# Patient Record
Sex: Male | Born: 1970 | Race: Black or African American | Hispanic: No | Marital: Married | Smoking: Current every day smoker
Health system: Southern US, Community
[De-identification: ages and names within clinical notes are randomized; demographics above are authoritative.]

## PROBLEM LIST (undated history)

## (undated) DIAGNOSIS — F1910 Other psychoactive substance abuse, uncomplicated: Secondary | ICD-10-CM

## (undated) DIAGNOSIS — J40 Bronchitis, not specified as acute or chronic: Secondary | ICD-10-CM

---

## 2001-01-05 ENCOUNTER — Emergency Department (HOSPITAL_COMMUNITY): Admission: EM | Admit: 2001-01-05 | Discharge: 2001-01-05 | Payer: Self-pay | Admitting: Emergency Medicine

## 2001-01-05 ENCOUNTER — Encounter: Payer: Self-pay | Admitting: Emergency Medicine

## 2001-02-06 ENCOUNTER — Emergency Department (HOSPITAL_COMMUNITY): Admission: EM | Admit: 2001-02-06 | Discharge: 2001-02-06 | Payer: Self-pay | Admitting: Emergency Medicine

## 2003-07-04 ENCOUNTER — Emergency Department (HOSPITAL_COMMUNITY): Admission: EM | Admit: 2003-07-04 | Discharge: 2003-07-04 | Payer: Self-pay | Admitting: Emergency Medicine

## 2008-01-04 ENCOUNTER — Emergency Department (HOSPITAL_COMMUNITY): Admission: EM | Admit: 2008-01-04 | Discharge: 2008-01-04 | Payer: Self-pay | Admitting: Emergency Medicine

## 2008-10-02 ENCOUNTER — Emergency Department (HOSPITAL_COMMUNITY): Admission: EM | Admit: 2008-10-02 | Discharge: 2008-10-02 | Payer: Self-pay | Admitting: Emergency Medicine

## 2009-01-22 ENCOUNTER — Emergency Department (HOSPITAL_COMMUNITY): Admission: EM | Admit: 2009-01-22 | Discharge: 2009-01-22 | Payer: Self-pay | Admitting: Emergency Medicine

## 2009-03-04 ENCOUNTER — Inpatient Hospital Stay (HOSPITAL_COMMUNITY): Admission: EM | Admit: 2009-03-04 | Discharge: 2009-03-07 | Payer: Self-pay | Admitting: Emergency Medicine

## 2009-09-11 ENCOUNTER — Emergency Department (HOSPITAL_COMMUNITY): Admission: EM | Admit: 2009-09-11 | Discharge: 2009-09-11 | Payer: Self-pay | Admitting: Emergency Medicine

## 2010-08-12 LAB — URINALYSIS, ROUTINE W REFLEX MICROSCOPIC
Bilirubin Urine: NEGATIVE
Nitrite: NEGATIVE
Specific Gravity, Urine: 1.017 (ref 1.005–1.030)
pH: 5.5 (ref 5.0–8.0)

## 2010-08-12 LAB — CBC
Hemoglobin: 14.3 g/dL (ref 13.0–17.0)
RDW: 13.5 % (ref 11.5–15.5)
WBC: 11.2 10*3/uL — ABNORMAL HIGH (ref 4.0–10.5)

## 2010-08-12 LAB — DIFFERENTIAL
Basophils Absolute: 0.1 10*3/uL (ref 0.0–0.1)
Lymphocytes Relative: 27 % (ref 12–46)
Lymphs Abs: 3.1 10*3/uL (ref 0.7–4.0)
Monocytes Absolute: 1.1 10*3/uL — ABNORMAL HIGH (ref 0.1–1.0)
Neutro Abs: 6.9 10*3/uL (ref 1.7–7.7)

## 2010-08-12 LAB — BASIC METABOLIC PANEL
BUN: 15 mg/dL (ref 6–23)
CO2: 20 mEq/L (ref 19–32)
Calcium: 8.5 mg/dL (ref 8.4–10.5)
Chloride: 109 mEq/L (ref 96–112)
Creatinine, Ser: 1.47 mg/dL (ref 0.4–1.5)
GFR calc Af Amer: >60 mL/min (ref >60)
GFR calc non Af Amer: 54 mL/min — ABNORMAL LOW (ref >60)
Glucose, Bld: 94 mg/dL (ref 70–99)
Potassium: 4 mEq/L (ref 3.5–5.1)
Sodium: 137 mEq/L (ref 135–145)

## 2010-08-12 LAB — POCT CARDIAC MARKERS
CKMB, poc: <1 ng/mL — ABNORMAL LOW (ref 1.0–8.0)
Myoglobin, poc: 59.3 ng/mL (ref 12–200)

## 2010-08-12 LAB — GLUCOSE, CAPILLARY: Glucose-Capillary: 111 mg/dL — ABNORMAL HIGH (ref 70–99)

## 2010-08-28 LAB — COMPREHENSIVE METABOLIC PANEL
ALT: 40 U/L (ref 0–53)
ALT: 79 U/L — ABNORMAL HIGH (ref 0–53)
Albumin: 2.9 g/dL — ABNORMAL LOW (ref 3.5–5.2)
Albumin: 3.2 g/dL — ABNORMAL LOW (ref 3.5–5.2)
Alkaline Phosphatase: 87 U/L (ref 39–117)
BUN: 5 mg/dL — ABNORMAL LOW (ref 6–23)
BUN: 6 mg/dL (ref 6–23)
Calcium: 9.3 mg/dL (ref 8.4–10.5)
Chloride: 115 mEq/L — ABNORMAL HIGH (ref 96–112)
Creatinine, Ser: 0.92 mg/dL (ref 0.4–1.5)
Creatinine, Ser: 1.01 mg/dL (ref 0.4–1.5)
GFR calc Af Amer: >60 mL/min (ref >60)
GFR calc non Af Amer: >60 mL/min (ref >60)
Glucose, Bld: 101 mg/dL — ABNORMAL HIGH (ref 70–99)
Glucose, Bld: 111 mg/dL — ABNORMAL HIGH (ref 70–99)
Potassium: 3.7 mEq/L (ref 3.5–5.1)
Sodium: 139 mEq/L (ref 135–145)
Sodium: 140 mEq/L (ref 135–145)
Total Bilirubin: 0.5 mg/dL (ref 0.3–1.2)
Total Bilirubin: 0.7 mg/dL (ref 0.3–1.2)
Total Protein: 5.8 g/dL — ABNORMAL LOW (ref 6.0–8.3)
Total Protein: 6.3 g/dL (ref 6.0–8.3)
Total Protein: 6.3 g/dL (ref 6.0–8.3)

## 2010-08-28 LAB — CARDIAC PANEL(CRET KIN+CKTOT+MB+TROPI)
CK, MB: 8.5 ng/mL — ABNORMAL HIGH (ref 0.3–4.0)
Relative Index: 1.3 (ref 0.0–2.5)
Relative Index: 1.3 (ref 0.0–2.5)
Total CK: 435 U/L — ABNORMAL HIGH (ref 7–232)
Total CK: 611 U/L — ABNORMAL HIGH (ref 7–232)
Total CK: 630 U/L — ABNORMAL HIGH (ref 7–232)
Troponin I: 0.01 ng/mL (ref 0.00–0.06)
Troponin I: 0.01 ng/mL (ref 0.00–0.06)

## 2010-08-28 LAB — CBC
HCT: 39.8 % (ref 39.0–52.0)
HCT: 40.8 % (ref 39.0–52.0)
HCT: 42.7 % (ref 39.0–52.0)
Hemoglobin: 13.8 g/dL (ref 13.0–17.0)
MCHC: 34.5 g/dL (ref 30.0–36.0)
MCV: 91.2 fL (ref 78.0–100.0)
MCV: 91.9 fL (ref 78.0–100.0)
Platelets: 187 10*3/uL (ref 150–400)
Platelets: 205 10*3/uL (ref 150–400)
Platelets: 225 10*3/uL (ref 150–400)
RDW: 13 % (ref 11.5–15.5)
RDW: 13.2 % (ref 11.5–15.5)
RDW: 13.5 % (ref 11.5–15.5)
WBC: 13.1 10*3/uL — ABNORMAL HIGH (ref 4.0–10.5)

## 2010-08-28 LAB — BLOOD GAS, VENOUS
Acid-base deficit: 5.1 mmol/L — ABNORMAL HIGH (ref 0.0–2.0)
Bicarbonate: 20.4 mEq/L (ref 20.0–24.0)
FIO2: 0.21 %
O2 Saturation: 88.3 %
Patient temperature: 98.6
TCO2: 18.3 mmol/L (ref 0–100)

## 2010-08-28 LAB — RAPID URINE DRUG SCREEN, HOSP PERFORMED
Barbiturates: NOT DETECTED
Opiates: NOT DETECTED
Tetrahydrocannabinol: NOT DETECTED

## 2010-08-28 LAB — HEPATITIS PANEL, ACUTE
HCV Ab: NEGATIVE
Hep B C IgM: NEGATIVE
Hepatitis B Surface Ag: NEGATIVE

## 2010-08-28 LAB — DIFFERENTIAL
Basophils Absolute: 0.1 10*3/uL (ref 0.0–0.1)
Lymphs Abs: 4.1 10*3/uL — ABNORMAL HIGH (ref 0.7–4.0)
Monocytes Absolute: 0.9 10*3/uL (ref 0.1–1.0)

## 2010-08-28 LAB — POCT I-STAT, CHEM 8
BUN: 7 mg/dL (ref 6–23)
Calcium, Ion: 1.03 mmol/L — ABNORMAL LOW (ref 1.12–1.32)
Creatinine, Ser: 1.4 mg/dL (ref 0.4–1.5)
Glucose, Bld: 83 mg/dL (ref 70–99)
TCO2: 18 mmol/L (ref 0–100)

## 2010-08-28 LAB — URINE CULTURE: Colony Count: NO GROWTH

## 2010-08-28 LAB — APTT: aPTT: 25 seconds (ref 24–37)

## 2010-08-28 LAB — MAGNESIUM: Magnesium: 2.2 mg/dL (ref 1.5–2.5)

## 2010-08-28 LAB — HEPATIC FUNCTION PANEL
ALT: 64 U/L — ABNORMAL HIGH (ref 0–53)
Bilirubin, Direct: 0.1 mg/dL (ref 0.0–0.3)
Indirect Bilirubin: 0.7 mg/dL (ref 0.3–0.9)

## 2010-08-28 LAB — BASIC METABOLIC PANEL
BUN: 5 mg/dL — ABNORMAL LOW (ref 6–23)
Calcium: 7.9 mg/dL — ABNORMAL LOW (ref 8.4–10.5)
Creatinine, Ser: 0.87 mg/dL (ref 0.4–1.5)
GFR calc Af Amer: >60 mL/min (ref >60)

## 2010-08-28 LAB — URINALYSIS, ROUTINE W REFLEX MICROSCOPIC
Bilirubin Urine: NEGATIVE
Nitrite: NEGATIVE
Specific Gravity, Urine: 1.011 (ref 1.005–1.030)
pH: 5.5 (ref 5.0–8.0)

## 2010-08-28 LAB — ACETAMINOPHEN LEVEL: Acetaminophen (Tylenol), Serum: <10 ug/mL — ABNORMAL LOW (ref 10–30)

## 2010-08-28 LAB — PROTIME-INR: Prothrombin Time: 12.9 seconds (ref 11.6–15.2)

## 2010-09-02 LAB — URINALYSIS, ROUTINE W REFLEX MICROSCOPIC
Nitrite: NEGATIVE
Specific Gravity, Urine: 1.023 (ref 1.005–1.030)
Urobilinogen, UA: 0.2 mg/dL (ref 0.0–1.0)

## 2010-09-02 LAB — DIFFERENTIAL
Basophils Absolute: 0.2 10*3/uL — ABNORMAL HIGH (ref 0.0–0.1)
Basophils Relative: 1 % (ref 0–1)
Neutro Abs: 7.6 10*3/uL (ref 1.7–7.7)
Neutrophils Relative %: 63 % (ref 43–77)

## 2010-09-02 LAB — POCT I-STAT, CHEM 8
Glucose, Bld: 90 mg/dL (ref 70–99)
HCT: 47 % (ref 39.0–52.0)
Hemoglobin: 16 g/dL (ref 13.0–17.0)
Potassium: 3.8 mEq/L (ref 3.5–5.1)
Sodium: 136 mEq/L (ref 135–145)

## 2010-09-02 LAB — CBC
MCHC: 35.5 g/dL (ref 30.0–36.0)
RBC: 4.94 MIL/uL (ref 4.22–5.81)
RDW: 14.7 % (ref 11.5–15.5)
WBC: 12 10*3/uL — ABNORMAL HIGH (ref 4.0–10.5)

## 2010-10-20 ENCOUNTER — Emergency Department (HOSPITAL_COMMUNITY)
Admission: EM | Admit: 2010-10-20 | Discharge: 2010-10-20 | Disposition: A | Discharge status: Home / Self Care | Payer: Self-pay | Attending: Emergency Medicine | Admitting: Emergency Medicine

## 2010-10-20 DIAGNOSIS — R220 Localized swelling, mass and lump, head: Secondary | ICD-10-CM | POA: Insufficient documentation

## 2010-10-20 DIAGNOSIS — K089 Disorder of teeth and supporting structures, unspecified: Secondary | ICD-10-CM | POA: Insufficient documentation

## 2010-10-20 DIAGNOSIS — K047 Periapical abscess without sinus: Secondary | ICD-10-CM | POA: Insufficient documentation

## 2010-10-20 DIAGNOSIS — R599 Enlarged lymph nodes, unspecified: Secondary | ICD-10-CM | POA: Insufficient documentation

## 2010-10-20 DIAGNOSIS — K029 Dental caries, unspecified: Secondary | ICD-10-CM | POA: Insufficient documentation

## 2011-07-29 ENCOUNTER — Emergency Department (HOSPITAL_COMMUNITY): Payer: Self-pay

## 2011-07-29 ENCOUNTER — Other Ambulatory Visit: Payer: Self-pay

## 2011-07-29 ENCOUNTER — Emergency Department (HOSPITAL_COMMUNITY)
Admission: EM | Admit: 2011-07-29 | Discharge: 2011-07-29 | Disposition: A | Discharge status: Home / Self Care | Payer: Self-pay | Attending: Emergency Medicine | Admitting: Emergency Medicine

## 2011-07-29 ENCOUNTER — Encounter (HOSPITAL_COMMUNITY): Payer: Self-pay | Admitting: Adult Health

## 2011-07-29 DIAGNOSIS — G510 Bell's palsy: Secondary | ICD-10-CM | POA: Insufficient documentation

## 2011-07-29 DIAGNOSIS — J3489 Other specified disorders of nose and nasal sinuses: Secondary | ICD-10-CM | POA: Insufficient documentation

## 2011-07-29 DIAGNOSIS — R29810 Facial weakness: Secondary | ICD-10-CM | POA: Insufficient documentation

## 2011-07-29 HISTORY — DX: Bronchitis, not specified as acute or chronic: J40

## 2011-07-29 LAB — BASIC METABOLIC PANEL
Chloride: 103 mEq/L (ref 96–112)
Creatinine, Ser: 1.02 mg/dL (ref 0.50–1.35)
GFR calc Af Amer: >90 mL/min (ref >90)
Potassium: 3.6 mEq/L (ref 3.5–5.1)

## 2011-07-29 LAB — DIFFERENTIAL
Basophils Absolute: 0 10*3/uL (ref 0.0–0.1)
Lymphocytes Relative: 29 % (ref 12–46)
Monocytes Relative: 8 % (ref 3–12)
Neutro Abs: 8.2 10*3/uL — ABNORMAL HIGH (ref 1.7–7.7)
Neutrophils Relative %: 62 % (ref 43–77)

## 2011-07-29 LAB — CBC
HCT: 43.3 % (ref 39.0–52.0)
RDW: 13.7 % (ref 11.5–15.5)
WBC: 13.3 10*3/uL — ABNORMAL HIGH (ref 4.0–10.5)

## 2011-07-29 MED ORDER — VALACYCLOVIR HCL 1 G PO TABS: 1000.0000 mg | ORAL_TABLET | Freq: Three times a day (TID) | ORAL | Status: DC

## 2011-07-29 MED ORDER — ACYCLOVIR 400 MG PO TABS: 800.0000 mg | ORAL_TABLET | Freq: Every day | ORAL | Status: AC

## 2011-07-29 MED ORDER — PREDNISONE 20 MG PO TABS
60.0000 mg | ORAL_TABLET | Freq: Once | ORAL | Status: AC
  Administered 2011-07-29: 60 mg via ORAL
  Filled 2011-07-29: qty 3

## 2011-07-29 MED ORDER — PREDNISONE 20 MG PO TABS: 60.0000 mg | ORAL_TABLET | Freq: Once | ORAL | Status: AC

## 2011-07-29 MED ORDER — VALACYCLOVIR HCL 500 MG PO TABS
1000.0000 mg | ORAL_TABLET | Freq: Once | ORAL | Status: AC
  Administered 2011-07-29: 1000 mg via ORAL
  Filled 2011-07-29: qty 2

## 2011-07-29 NOTE — ED Notes (Signed)
MD at bedside. 

## 2011-07-29 NOTE — ED Provider Notes (Signed)
History     CSN: [id number]  Arrival date & time 07/29/11  1326   First MD Initiated Contact with Patient 07/29/11 1327      Chief Complaint  Patient presents with  . Facial Droop     HPI The patient awoke this morning, approximately 5 hours ago with new right facial droop.  He notes that when he went to bed he felt an odd sensation radiating from his sternum to his face, no focal asymmetry that time.  Since the onset of symptoms this morning there has been persistent right facial droop, difficulty opening his right eye, uneven smile.  Patient denies any weakness, loss of sensation, confusion, visual changes. No headache, no dyspnea, no chest pain.  The patient smokes and drinks No past medical history on file.  No past surgical history on file.  No family history on file.  History  Substance Use Topics  . Smoking status: Not on file  . Smokeless tobacco: Not on file  . Alcohol Use: Not on file   Patient also uses cocaine.   Review of Systems  Constitutional:       Per HPI, otherwise negative  HENT:       Per HPI, otherwise negative  Eyes: Negative.   Respiratory:       Per HPI, otherwise negative  Cardiovascular:       Per HPI, otherwise negative  Gastrointestinal: Negative for vomiting.  Genitourinary: Negative.   Musculoskeletal:       Per HPI, otherwise negative  Skin: Negative.   Neurological: Positive for facial asymmetry. Negative for dizziness, tremors, seizures, syncope, speech difficulty, weakness, light-headedness, numbness and headaches.    Allergies  Review of patient's allergies indicates not on file.  Home Medications  No current outpatient prescriptions on file.  BP 149/94  Pulse 95  Resp 18  SpO2 96%  Physical Exam  Nursing note and vitals reviewed. Constitutional: He is oriented to person, place, and time. He appears well-developed. No distress.  HENT:  Head: Normocephalic and atraumatic. Head is without raccoon's eyes, without  Battle's sign, without right periorbital erythema and without left periorbital erythema.       Right facial droop  Eyes: Conjunctivae and EOM are normal.  Cardiovascular: Normal rate and regular rhythm.   Pulmonary/Chest: Effort normal. No stridor. No respiratory distress.  Abdominal: He exhibits no distension.  Musculoskeletal: He exhibits no edema.  Neurological: He is alert and oriented to person, place, and time. A cranial nerve deficit is present. No sensory deficit. He exhibits abnormal muscle tone. Coordination normal.       The patient's right facial droop is consistent with Bell's palsy.  Skin: Skin is warm and dry.  Psychiatric: He has a normal mood and affect.    ED Course  Procedures (including critical care time)   Labs Reviewed  BASIC METABOLIC PANEL  CBC  DIFFERENTIAL   No results found.   No diagnosis found.  CT, CXR both reviewed by me.    MDM  This 41-year-old male with history of substance abuse now presents with new right facial droop.  On exam the patient is in no distress, with unremarkable vital signs he does have physical exam findings consistent with Bell's palsy.  The patient's evaluation here was largely unremarkable.  I discussed all findings thoughts with the patient and his companion.  The patient received a dose of steroids as well as antivirals in the emergency department, was discharged with these medications to follow up with  his primary care physician.  Gerhard Munch, MD 07/30/11 [phone]

## 2011-07-29 NOTE — Discharge Instructions (Signed)
Bell's Palsy  Bell's palsy is a condition in which the muscles on one side of the face cannot move (paralysis). This is because the nerves in the face are paralyzed. It is most often thought to be caused by a virus. The virus causes swelling of the nerve that controls movement on one side of the face. The nerve travels through a tight space surrounded by bone. When the nerve swells, it can be compressed by the bone. This results in damage to the protective covering around the nerve. This damage interferes with how the nerve communicates with the muscles of the face. As a result, it can cause weakness or paralysis of the facial muscles.   Injury (trauma), tumor, and surgery may cause Bell's palsy, but most of the time the cause is unknown. It is a relatively common condition. It starts suddenly (abrupt onset) with the paralysis usually ending within 2 days. Bell's palsy is not dangerous. But because the eye does not close properly, you may need care to keep the eye from getting dry. This can include splinting (to keep the eye shut) or moistening with artificial tears. Bell's palsy very seldom occurs on both sides of the face at the same time.  SYMPTOMS    Eyebrow sagging.   Drooping of the eyelid and corner of the mouth.   Inability to close one eye.   Loss of taste on the front of the tongue.   Sensitivity to loud noises.  TREATMENT   The treatment is usually non-surgical. If the patient is seen within the first 24 to 48 hours, a short course of steroids may be prescribed, in an attempt to shorten the length of the condition. Antiviral medicines may also be used with the steroids, but it is unclear if they are helpful.   You will need to protect your eye, if you cannot close it. The cornea (clear covering over your eye) will become dry and can be damaged. Artificial tears can be used to keep your eye moist. Glasses or an eye patch should be worn to protect your eye.  PROGNOSIS   Recovery is variable, ranging  from days to months. Although the problem usually goes away completely (about 80% of cases resolve), predicting the outcome is impossible. Most people improve within 3 weeks of when the symptoms began. Improvement may continue for 3 to 6 months. A small number of people have moderate to severe weakness that is permanent.   HOME CARE INSTRUCTIONS    If your caregiver prescribed medication to reduce swelling in the nerve, use as directed. Do not stop taking the medication unless directed by your caregiver.   Use moisturizing eye drops as needed to prevent drying of your eye, as directed by your caregiver.   Protect your eye, as directed by your caregiver.   Use facial massage and exercises, as directed by your caregiver.   Perform your normal activities, and get your normal rest.  SEEK IMMEDIATE MEDICAL CARE IF:    There is pain, redness or irritation in the eye.   You or your child has an oral temperature above 102 F (38.9 C), not controlled by medicine.  MAKE SURE YOU:    Understand these instructions.   Will watch your condition.   Will get help right away if you are not doing well or get worse.  Document Released: 05/11/2005 Document Revised: 04/30/2011 Document Reviewed: 05/20/2009  Exit[employer] Patient Information 2012 [employer].

## 2011-07-29 NOTE — ED Notes (Signed)
Pt reports going to bed last night around 1am and felt an "odd sensation" from his chest to his head and had an episode of sweating. Woke this am with R sided facial droop. Unable to open R eye completely, unequal smile, unable to raise R eyebrow. C/o weakness at window. Ambulatory to window. Jeraldine Loots, EDP made aware, pt brought to Acute Rm 19. EDP at bedside.

## 2011-07-29 NOTE — ED Notes (Signed)
Woke up this am with right sided facial droop. Alert and oriented, answering questions appropriately.

## 2011-10-19 ENCOUNTER — Emergency Department (HOSPITAL_COMMUNITY)
Admission: EM | Admit: 2011-10-19 | Discharge: 2011-10-19 | Disposition: A | Discharge status: Home / Self Care | Payer: Self-pay | Attending: Emergency Medicine | Admitting: Emergency Medicine

## 2011-10-19 ENCOUNTER — Encounter (HOSPITAL_COMMUNITY): Payer: Self-pay | Admitting: *Deleted

## 2011-10-19 ENCOUNTER — Emergency Department (HOSPITAL_COMMUNITY): Payer: Self-pay

## 2011-10-19 DIAGNOSIS — R7989 Other specified abnormal findings of blood chemistry: Secondary | ICD-10-CM | POA: Insufficient documentation

## 2011-10-19 DIAGNOSIS — R079 Chest pain, unspecified: Secondary | ICD-10-CM | POA: Insufficient documentation

## 2011-10-19 DIAGNOSIS — R945 Abnormal results of liver function studies: Secondary | ICD-10-CM

## 2011-10-19 HISTORY — DX: Other psychoactive substance abuse, uncomplicated: F19.10

## 2011-10-19 LAB — DIFFERENTIAL
Basophils Absolute: 0 10*3/uL (ref 0.0–0.1)
Basophils Relative: 0 % (ref 0–1)
Eosinophils Relative: 1 % (ref 0–5)
Monocytes Absolute: 0.6 10*3/uL (ref 0.1–1.0)

## 2011-10-19 LAB — COMPREHENSIVE METABOLIC PANEL
ALT: 66 U/L — ABNORMAL HIGH (ref 0–53)
Albumin: 3.1 g/dL — ABNORMAL LOW (ref 3.5–5.2)
Alkaline Phosphatase: 133 U/L — ABNORMAL HIGH (ref 39–117)
BUN: 8 mg/dL (ref 6–23)
Chloride: 106 mEq/L (ref 96–112)
GFR calc Af Amer: >90 mL/min (ref >90)
Glucose, Bld: 92 mg/dL (ref 70–99)
Potassium: 3.5 mEq/L (ref 3.5–5.1)
Sodium: 139 mEq/L (ref 135–145)
Total Bilirubin: 0.7 mg/dL (ref 0.3–1.2)
Total Protein: 6.1 g/dL (ref 6.0–8.3)

## 2011-10-19 LAB — CBC
HCT: 39.9 % (ref 39.0–52.0)
MCHC: 36.3 g/dL — ABNORMAL HIGH (ref 30.0–36.0)
MCV: 84.5 fL (ref 78.0–100.0)
Platelets: 199 10*3/uL (ref 150–400)
RDW: 13.8 % (ref 11.5–15.5)
WBC: 9.9 10*3/uL (ref 4.0–10.5)

## 2011-10-19 LAB — LIPASE, BLOOD: Lipase: 50 U/L (ref 11–59)

## 2011-10-19 MED ORDER — SODIUM CHLORIDE 0.9 % IV SOLN
1000.0000 mL | Freq: Once | INTRAVENOUS | Status: AC
  Administered 2011-10-19: 1000 mL via INTRAVENOUS

## 2011-10-19 MED ORDER — SODIUM CHLORIDE 0.9 % IV SOLN
1000.0000 mL | INTRAVENOUS | Status: DC
  Administered 2011-10-19: 1000 mL via INTRAVENOUS

## 2011-10-19 NOTE — ED Notes (Signed)
Patient admits to doing "coke" on Friday, has history of substance abue

## 2011-10-19 NOTE — ED Provider Notes (Signed)
History     CSN: [id number]  Arrival date & time 10/19/11  4098   First MD Initiated Contact with Patient 10/19/11 [phone]      Chief Complaint  Patient presents with  . Chest Pain    (Consider location/radiation/quality/duration/timing/severity/associated sxs/prior treatment) HPI Comments: Patient with a history of cocaine, tobacco & alcohol abuse presents emergency department chief complaint of chest pain.  Symptoms onset began last evening, located in left chest radiating to left back, severity 7/10, intermittent and sharp in nature, lasting 1-3 seconds and reoccurring randomly.  Patient denies associated symptoms including shortness of breath, dyspnea on exertion, orthopnea, PND, pleurisy, palpitations, fevers, night sweats, chills, cough, claudication, recent trauma, abdominal pain, N//V/ or diarrhea. Pt states he is currently pain free without any other complaints.   The history is provided by the patient.    Past Medical History  Diagnosis Date  . Bronchitis   . Substance abuse     History reviewed. No pertinent past surgical history.  No family history on file.  History  Substance Use Topics  . Smoking status: Current Everyday Smoker  . Smokeless tobacco: Not on file  . Alcohol Use: Yes      Review of Systems  Constitutional: Negative for fever, chills, diaphoresis, activity change, fatigue and unexpected weight change.  HENT: Negative for congestion, neck pain and neck stiffness.   Eyes: Negative for visual disturbance.  Respiratory: Negative for apnea, cough, chest tightness, shortness of breath, wheezing and stridor.   Cardiovascular: Positive for chest pain. Negative for palpitations and leg swelling.  Gastrointestinal: Negative for nausea, vomiting, abdominal pain, diarrhea and blood in stool.  Genitourinary: Negative for dysuria, urgency, hematuria and flank pain.  Musculoskeletal: Negative for myalgias, back pain and gait problem.  Skin: Negative for  pallor.  Neurological: Negative for dizziness, syncope, light-headedness and headaches.  All other systems reviewed and are negative.    Allergies  Review of patient's allergies indicates no known allergies.  Home Medications  No current outpatient prescriptions on file.  BP 128/68  Pulse 73  Temp(Src) 98.3 F (36.8 C) (Oral)  Resp 16  SpO2 96%  Physical Exam  Nursing note and vitals reviewed. Constitutional: He appears well-developed and well-nourished. No distress.  HENT:  Head: Normocephalic and atraumatic.  Eyes: Conjunctivae and EOM are normal. Pupils are equal, round, and reactive to light.  Neck: Normal range of motion. Neck supple. Normal carotid pulses and no JVD present. Carotid bruit is not present. No rigidity. Normal range of motion present.  Cardiovascular: Normal rate, regular rhythm, S1 normal, S2 normal, normal heart sounds, intact distal pulses and normal pulses.  Exam reveals no gallop and no friction rub.   No murmur heard.      No pitting edema bilaterally, RRR, no aberrant sounds on auscultations, distal pulses intact, no carotid bruit or JVD.   Pulmonary/Chest: Effort normal and breath sounds normal. No accessory muscle usage or stridor. No respiratory distress. He exhibits no tenderness and no bony tenderness.  Abdominal: Bowel sounds are normal.       Soft non tender. Non pulsatile aorta.   Skin: Skin is warm, dry and intact. No rash noted. He is not diaphoretic. No cyanosis. Nails show no clubbing.    ED Course  Procedures (including critical care time)   Labs Reviewed  COMPREHENSIVE METABOLIC PANEL  CBC  DIFFERENTIAL  LIPASE, BLOOD   No results found.   No diagnosis found.   Date: 10/19/2011  Rate: 69  Rhythm: normal  sinus rhythm  QRS Axis: normal  Intervals: normal  ST/T Wave abnormalities: normal  Conduction Disutrbances: none  Narrative Interpretation: Consider anteroseptal infarct (on old EKG in march as well)  Old EKG Reviewed:  No significant changes noted  Labs and imaging reviewed and discussed with pt. Recommended pt get follow up lifer enzymes and possibly imaging based on today's results. Pt verbalizes understanding. Discussed substance abuse cessation including smoking, alc effects on the liver, and cardiac effects of cocaine.  MDM  Chest pain  Patient is to be discharged with recommendation to follow up with PCP in regards to today's hospital visit. Chest pain is not likely of cardiac or pulmonary etiology d/t presentation, perc negative, VSS, no tracheal deviation, no JVD or new murmur, RRR, breath sounds equal bilaterally, EKG without acute abnormalities, negative troponin x 2, and negative CXR. Pt has been advised start a PPI and return to the ED is CP becomes exertional, associated with diaphoresis or nausea, radiates to left jaw/arm, worsens or becomes concerning in any way. Pt appears reliable for follow up and is agreeable to discharge.   Case has been discussed with and seen by Dr. Ranae Palms  who agrees with the above plan to discharge.          Jaci Carrel, [place] 10/19/11 1237

## 2011-10-19 NOTE — ED Notes (Signed)
Given ECG to Dr. Ranae Palms with older copy [id number]

## 2011-10-19 NOTE — ED Provider Notes (Signed)
Medical screening examination/treatment/procedure(s) were performed by non-physician practitioner and as supervising physician I was immediately available for consultation/collaboration.   Anahid Eskelson, MD 10/19/11 1454 

## 2011-10-19 NOTE — Discharge Instructions (Signed)
You should follow up as soon as possible with a GI doctor to have further liver studies performed.  Read instructions below for reasons to return to the Emergency Department. It is recommended that your follow up with your Primary Care Doctor in regards to today's visit. If you do not have a doctor, use the resource guide listed below to help you find one.   Chest Pain (Nonspecific)  HOME CARE INSTRUCTIONS  For the next few days, avoid physical activities that bring on chest pain. Continue physical activities as directed.  Do not smoke cigarettes or drink alcohol until your symptoms are gone.  Only take over-the-counter or prescription medicine for pain, discomfort, or fever as directed by your caregiver.  Follow your caregiver's suggestions for further testing if your chest pain does not go away.  Keep any follow-up appointments you made. If you do not go to an appointment, you could develop lasting (chronic) problems with pain. If there is any problem keeping an appointment, you must call to reschedule.  SEEK MEDICAL CARE IF:  You think you are having problems from the medicine you are taking. Read your medicine instructions carefully.  Your chest pain does not go away, even after treatment.  You develop a rash with blisters on your chest.  SEEK IMMEDIATE MEDICAL CARE IF:  You have increased chest pain or pain that spreads to your arm, neck, jaw, back, or belly (abdomen).  You develop shortness of breath, an increasing cough, or you are coughing up blood.  You have severe back or abdominal pain, feel sick to your stomach (nauseous) or throw up (vomit).  You develop severe weakness, fainting, or chills.  You have an oral temperature above 102 F (38.9 C), not controlled by medicine.   THIS IS AN EMERGENCY. Do not wait to see if the pain will go away. Get medical help at once. Call your local emergency services (911 in [place] Do not drive yourself to the hospital.   RESOURCE GUIDE  Dental  Problems  Patients with Medicaid: [facility] [phone] [address]                                           [phone] [address] Phone:  [phone]                                                  Phone:  [phone]  If unable to pay or uninsured, contact:  [facility] or [facility]. to become qualified for the adult dental clinic.  Chronic Pain Problems Contact [facility]  [phone] Patients need to be referred by their primary care doctor.  Insufficient Money for Medicine Contact [employer]:  call "211" or [facility] 854-268-6183.  No Primary Care Doctor Call [facility]  [phone] Other agencies that provide inexpensive medical care    [facility]  [phone]    [facility]  [phone]    [facility]  7074007000    [facility]  [phone]    [facility]  8286871370  [facility]  [phone]  Psychological Services [facility]  [phone] [facility]  [phone] [facility]   [phone] (emergency services ([phone])  Substance Abuse Resources [facility]  5313972590 [facility] [phone] [address] [phone] Floydene Flock [phone] Residential & Outpatient Substance Abuse Program  [phone]  Abuse/Neglect [facility] Child Abuse Hotline (858)237-6093 [facility] Child Abuse Hotline 719-563-2858 (After Hours)  Emergency Shelter [facility] [phone]  Maternity Homes [facility] at the [place] [facility] [place] [phone] [place] Alert Services ([phone]  MRSA Hotline #:   409-813-3861    [facility] Resources  [facility] [place]     [employer]                          [facility] Dept. [address] [place]                       [address]      [address]    Blondell Reveal Phone:  [phone]                                   Phone:  [phone]                 Phone:  [phone]  [facility] Phone:  [phone]  [facility] Child Abuse Hotline [phone] [phone] (After Hours)

## 2011-10-19 NOTE — ED Notes (Signed)
Patient with history of chest pain, earlier today the chest pain woke him up.  Left sided chest pain radiating to his back intermittent in nature.

## 2011-10-19 NOTE — ED Notes (Signed)
Report received from kayla rn. Pt resting with family at bedside

## 2013-04-11 ENCOUNTER — Emergency Department (HOSPITAL_COMMUNITY)
Admission: EM | Admit: 2013-04-11 | Discharge: 2013-04-11 | Disposition: A | Discharge status: Home / Self Care | Payer: Self-pay | Attending: Emergency Medicine | Admitting: Emergency Medicine

## 2013-04-11 ENCOUNTER — Encounter (HOSPITAL_COMMUNITY): Payer: Self-pay | Admitting: Emergency Medicine

## 2013-04-11 ENCOUNTER — Emergency Department (HOSPITAL_COMMUNITY): Payer: Self-pay

## 2013-04-11 DIAGNOSIS — R0789 Other chest pain: Secondary | ICD-10-CM

## 2013-04-11 DIAGNOSIS — R071 Chest pain on breathing: Secondary | ICD-10-CM | POA: Insufficient documentation

## 2013-04-11 DIAGNOSIS — Z8709 Personal history of other diseases of the respiratory system: Secondary | ICD-10-CM | POA: Insufficient documentation

## 2013-04-11 DIAGNOSIS — F17200 Nicotine dependence, unspecified, uncomplicated: Secondary | ICD-10-CM | POA: Insufficient documentation

## 2013-04-11 DIAGNOSIS — Z791 Long term (current) use of non-steroidal anti-inflammatories (NSAID): Secondary | ICD-10-CM | POA: Insufficient documentation

## 2013-04-11 LAB — CBC
HCT: 41.1 % (ref 39.0–52.0)
Hemoglobin: 15.4 g/dL (ref 13.0–17.0)
MCH: 32.5 pg (ref 26.0–34.0)
MCHC: 37.5 g/dL — ABNORMAL HIGH (ref 30.0–36.0)
MCV: 86.7 fL (ref 78.0–100.0)
RBC: 4.74 MIL/uL (ref 4.22–5.81)

## 2013-04-11 LAB — BASIC METABOLIC PANEL
BUN: 16 mg/dL (ref 6–23)
CO2: 17 mEq/L — ABNORMAL LOW (ref 19–32)
Calcium: 9.1 mg/dL (ref 8.4–10.5)
Creatinine, Ser: 0.93 mg/dL (ref 0.50–1.35)
Glucose, Bld: 94 mg/dL (ref 70–99)
Sodium: 135 mEq/L (ref 135–145)

## 2013-04-11 LAB — POCT I-STAT TROPONIN I

## 2013-04-11 MED ORDER — OXYCODONE-ACETAMINOPHEN 5-325 MG PO TABS
1.0000 | ORAL_TABLET | Freq: Once | ORAL | Status: AC
  Administered 2013-04-11: 1 via ORAL
  Filled 2013-04-11: qty 1

## 2013-04-11 MED ORDER — NAPROXEN 500 MG PO TABS: 500.0000 mg | ORAL_TABLET | Freq: Two times a day (BID) | ORAL | Status: AC

## 2013-04-11 MED ORDER — HYDROCODONE-ACETAMINOPHEN 5-325 MG PO TABS: 1.0000 | ORAL_TABLET | Freq: Four times a day (QID) | ORAL | Status: AC | PRN

## 2013-04-11 NOTE — ED Notes (Signed)
Patient reports having chest pain that began last night.  C/O pain in his left chest and his left shoulder blade.  States that coughing makes the pain worse and breathing hard makes the pain worse.

## 2013-04-11 NOTE — ED Provider Notes (Addendum)
CSN: [id number]     Arrival date & time 04/11/13  1023 History   First MD Initiated Contact with Patient 04/11/13 1113     Chief Complaint  Patient presents with  . Chest Pain   (Consider location/radiation/quality/duration/timing/severity/associated sxs/prior Treatment) Patient is a 42 y.o. male presenting with chest pain. The history is provided by the patient (the pt complains of chest pain and upper back  pain with movement).  Chest Pain Pain location:  L chest Pain quality: dull   Pain radiates to:  Does not radiate Pain radiates to the back: no   Pain severity:  Moderate Onset quality:  Gradual Timing:  Intermittent Progression:  Unchanged Chronicity:  New Context: not breathing   Associated symptoms: no abdominal pain, no back pain, no cough, no fatigue and no headache     Past Medical History  Diagnosis Date  . Bronchitis   . Substance abuse    History reviewed. No pertinent past surgical history. History reviewed. No pertinent family history. History  Substance Use Topics  . Smoking status: Current Every Day Smoker  . Smokeless tobacco: Not on file  . Alcohol Use: Yes    Review of Systems  Constitutional: Negative for appetite change and fatigue.  HENT: Negative for congestion, ear discharge and sinus pressure.   Eyes: Negative for discharge.  Respiratory: Negative for cough.   Cardiovascular: Positive for chest pain.  Gastrointestinal: Negative for abdominal pain and diarrhea.  Genitourinary: Negative for frequency and hematuria.  Musculoskeletal: Negative for back pain.  Skin: Negative for rash.  Neurological: Negative for seizures and headaches.  Psychiatric/Behavioral: Negative for hallucinations.    Allergies  Review of patient's allergies indicates no known allergies.  Home Medications   Current Outpatient Rx  Name  Route  Sig  Dispense  Refill  . HYDROcodone-acetaminophen (NORCO/VICODIN) 5-325 MG per tablet   Oral   Take 1 tablet by mouth  every 6 (six) hours as needed for moderate pain.   20 tablet   0   . naproxen (NAPROSYN) 500 MG tablet   Oral   Take 1 tablet (500 mg total) by mouth 2 (two) times daily.   20 tablet   0    BP 134/82  Pulse 71  Temp(Src) 98.4 F (36.9 C) (Oral)  Resp 18  Wt 215 lb (97.523 kg)  SpO2 97% Physical Exam  Constitutional: He is oriented to person, place, and time. He appears well-developed.  HENT:  Head: Normocephalic.  Eyes: Conjunctivae and EOM are normal. No scleral icterus.  Neck: Neck supple. No thyromegaly present.  Cardiovascular: Normal rate and regular rhythm.  Exam reveals no gallop and no friction rub.   No murmur heard. Pulmonary/Chest: No stridor. He has no wheezes. He has no rales. He exhibits no tenderness.  Abdominal: He exhibits no distension. There is no tenderness. There is no rebound.  Musculoskeletal: Normal range of motion. He exhibits no edema.  Lymphadenopathy:    He has no cervical adenopathy.  Neurological: He is oriented to person, place, and time. He exhibits normal muscle tone. Coordination normal.  Skin: No rash noted. No erythema.  Psychiatric: He has a normal mood and affect. His behavior is normal.    ED Course  Procedures (including critical care time) Labs Review Labs Reviewed  CBC - Abnormal; Notable for the following:    MCHC 37.5 (*)    All other components within normal limits  BASIC METABOLIC PANEL - Abnormal; Notable for the following:    CO2 17 (*)  All other components within normal limits  POCT I-STAT TROPONIN I   Imaging Review Dg Chest 2 View  04/11/2013   CLINICAL DATA:  Left posterior in mid chest pain with inspiration  EXAM: CHEST  2 VIEW  COMPARISON:  10/19/2011  FINDINGS: Normal heart size, mediastinal contours, and pulmonary vascularity.  Lungs clear.  No pleural effusion or pneumothorax.  Bones unremarkable.  IMPRESSION: No acute abnormalities.   Electronically Signed   By: Ulyses Southward M.D.   On: 04/11/2013 12:32     EKG Interpretation   None       MDM   1. Chest wall pain        Benny Lennert, MD 04/11/13 1436  Benny Lennert, MD 04/24/13 [phone]

## 2013-04-11 NOTE — ED Notes (Signed)
Pt reports waking up this am with sharp chest pains and left side shoulder/back pain. Increases with movement and coughing. ekg done at triage. Airway intact.

## 2014-04-25 IMAGING — CR DG CHEST 2V
2 series · 2 of 2 positions shown · non-contrast
Comparison: 10/19/2011

CLINICAL DATA: Left posterior in mid chest pain with inspiration

EXAM:
CHEST  2 VIEW

[view not recorded (1 of 2)]
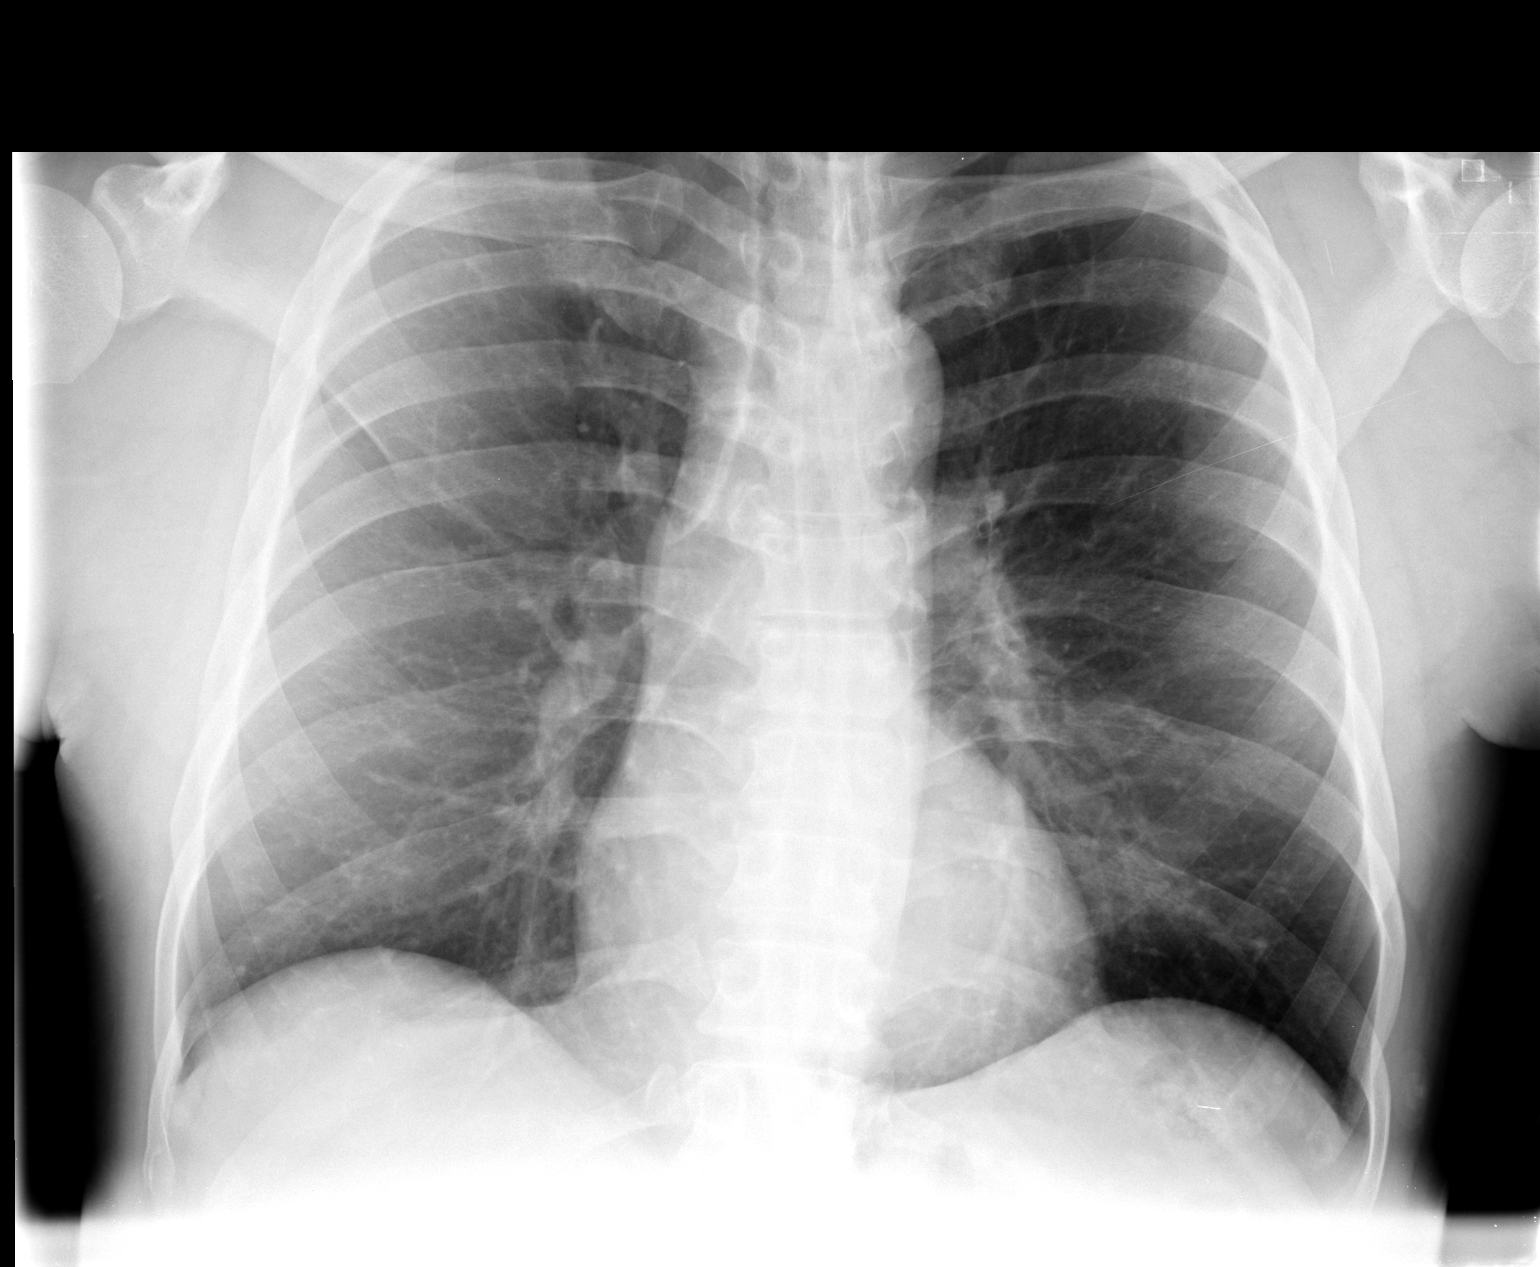

[view not recorded (2 of 2)]
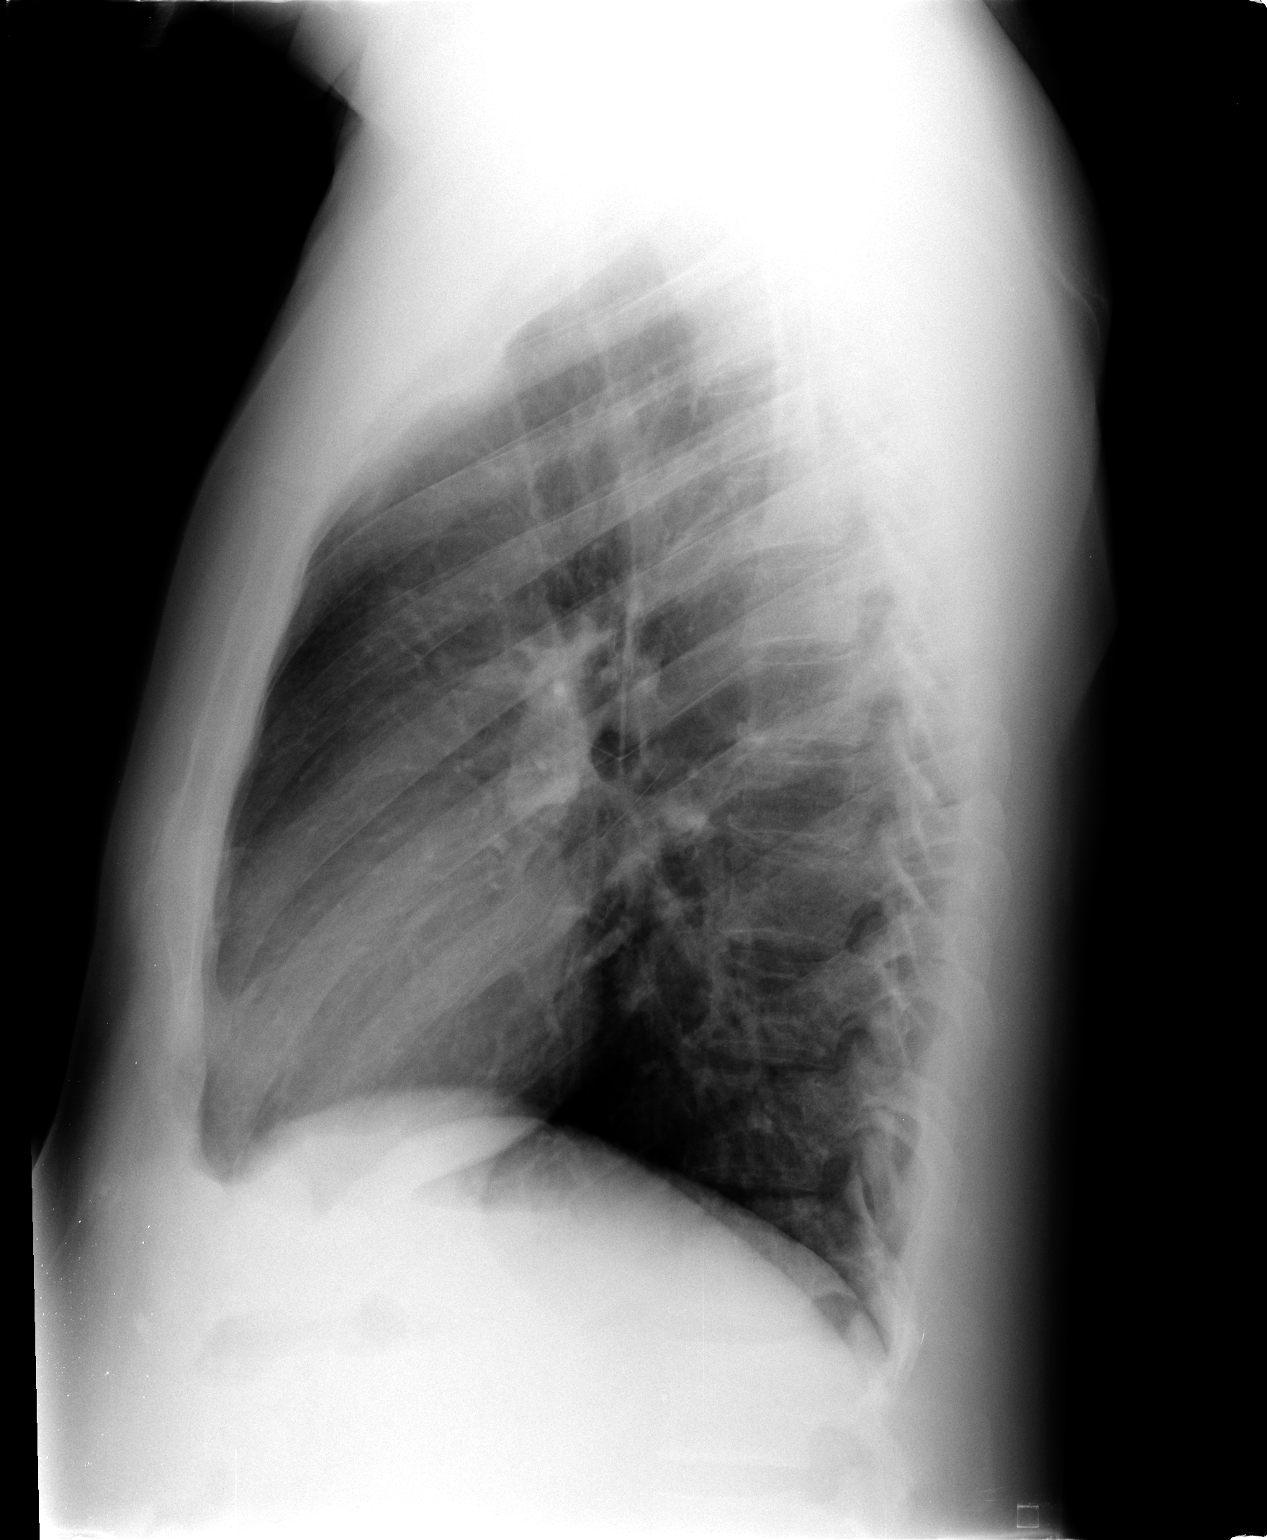

[2 of 2 positions shown; findings below may reference images not displayed]

FINDINGS: Normal heart size, mediastinal contours, and pulmonary vascularity.

Lungs clear.

No pleural effusion or pneumothorax.

Bones unremarkable.
IMPRESSION: No acute abnormalities.

## 2015-02-05 DIAGNOSIS — Y9389 Activity, other specified: Secondary | ICD-10-CM | POA: Insufficient documentation

## 2015-02-05 DIAGNOSIS — H10213 Acute toxic conjunctivitis, bilateral: Secondary | ICD-10-CM | POA: Insufficient documentation

## 2015-02-05 DIAGNOSIS — Z8709 Personal history of other diseases of the respiratory system: Secondary | ICD-10-CM | POA: Insufficient documentation

## 2015-02-05 DIAGNOSIS — Z720 Tobacco use: Secondary | ICD-10-CM | POA: Insufficient documentation

## 2015-02-05 DIAGNOSIS — Z791 Long term (current) use of non-steroidal anti-inflammatories (NSAID): Secondary | ICD-10-CM | POA: Insufficient documentation

## 2015-02-05 DIAGNOSIS — T5491XA Toxic effect of unspecified corrosive substance, accidental (unintentional), initial encounter: Secondary | ICD-10-CM | POA: Insufficient documentation

## 2015-02-05 DIAGNOSIS — Y998 Other external cause status: Secondary | ICD-10-CM | POA: Insufficient documentation

## 2015-02-05 DIAGNOSIS — Y92009 Unspecified place in unspecified non-institutional (private) residence as the place of occurrence of the external cause: Secondary | ICD-10-CM | POA: Insufficient documentation

## 2015-02-06 ENCOUNTER — Encounter (HOSPITAL_COMMUNITY): Payer: Self-pay | Admitting: *Deleted

## 2015-02-06 ENCOUNTER — Emergency Department (HOSPITAL_COMMUNITY)
Admission: EM | Admit: 2015-02-06 | Discharge: 2015-02-06 | Disposition: A | Discharge status: Home / Self Care | Payer: Self-pay | Attending: Emergency Medicine | Admitting: Emergency Medicine

## 2015-02-06 DIAGNOSIS — H10213 Acute toxic conjunctivitis, bilateral: Secondary | ICD-10-CM

## 2015-02-06 MED ORDER — FLUORESCEIN SODIUM 1 MG OP STRP
1.0000 | ORAL_STRIP | Freq: Once | OPHTHALMIC | Status: AC
  Administered 2015-02-06: 1 via OPHTHALMIC
  Filled 2015-02-06: qty 1

## 2015-02-06 MED ORDER — KETOROLAC TROMETHAMINE 0.5 % OP SOLN
1.0000 [drp] | Freq: Four times a day (QID) | OPHTHALMIC | Status: DC
  Administered 2015-02-06: 1 [drp] via OPHTHALMIC
  Filled 2015-02-06: qty 3

## 2015-02-06 MED ORDER — TOBRAMYCIN 0.3 % OP SOLN
1.0000 [drp] | Freq: Four times a day (QID) | OPHTHALMIC | Status: DC
  Administered 2015-02-06: 1 [drp] via OPHTHALMIC
  Filled 2015-02-06: qty 5

## 2015-02-06 MED ORDER — TETRACAINE HCL 0.5 % OP SOLN
2.0000 [drp] | Freq: Once | OPHTHALMIC | Status: AC
  Administered 2015-02-06: 2 [drp] via OPHTHALMIC
  Filled 2015-02-06: qty 2

## 2015-02-06 NOTE — ED Notes (Signed)
Unable to do visual acuity due to the inability to keep eyes open

## 2015-02-06 NOTE — ED Provider Notes (Signed)
CSN: [id number]     Arrival date & time 02/05/15  2356 History   This chart was scribed for Devoria Albe, MD by Arlan Organ, ED Scribe. This patient was seen in room A09C/A09C and the patient's care was started 12:23 AM.   Chief Complaint  Patient presents with  . Foreign Body in Eye   The history is provided by the patient. No language interpreter was used.    HPI Comments: Kenneth Whitehead is a 44 y.o. male without any pertinent past medical history who presents to the Emergency Department here for a foreign body to eyes bilaterally onset several hours prior to arrival. Pt will not be specific about when the incident occurred.  Pt states he dropped a home bleach bottle on a table when some splashed up into both eyes. Pt then panicked and began rubbing his eyes. Pain came on 2-3 hours after incident. Visual blurriness and redness also reported to eyes.He feels like he hs something in his eyes.  Eye cleansing attempted at home prior to arrival. Denies any fever, chills, nausea, vomiting, or shortness of breath. He is unaware of tetanus status. No known allergies to medications.  PCP: None  Past Medical History  Diagnosis Date  . Bronchitis   . Substance abuse    History reviewed. No pertinent past surgical history. No family history on file. Social History  Substance Use Topics  . Smoking status: Current Every Day Smoker  . Smokeless tobacco: Never Used  . Alcohol Use: Yes  smokes 1 ppd employed  Review of Systems  Constitutional: Negative for fever and chills.  Eyes: Positive for pain, redness and visual disturbance.  Respiratory: Negative for cough and shortness of breath.   Cardiovascular: Negative for chest pain.  Gastrointestinal: Negative for nausea, vomiting, abdominal pain and diarrhea.  Skin: Negative for rash.  Neurological: Negative for headaches.  Psychiatric/Behavioral: Negative for confusion.  All other systems reviewed and are negative.     Allergies  Review of  patient's allergies indicates no known allergies.  Home Medications   Prior to Admission medications   Medication Sig Start Date End Date Taking? Authorizing Provider  HYDROcodone-acetaminophen (NORCO/VICODIN) 5-325 MG per tablet Take 1 tablet by mouth every 6 (six) hours as needed for moderate pain. 04/11/13   Bethann Berkshire, MD  naproxen (NAPROSYN) 500 MG tablet Take 1 tablet (500 mg total) by mouth 2 (two) times daily. 04/11/13   Bethann Berkshire, MD   Triage Vitals: BP 102/71 mmHg  Pulse 104  Temp(Src) 98.1 F (36.7 C) (Oral)  Resp 20  Ht 6\' 2"  (1.88 m)  Wt 205 lb (92.987 kg)  BMI 26.31 kg/m2  SpO2 95%  Vital signs normal except for tachycardia    Physical Exam  Constitutional: He is oriented to person, place, and time. He appears well-developed and well-nourished.  Non-toxic appearance. He does not appear ill. No distress.  HENT:  Head: Normocephalic and atraumatic.  Right Ear: External ear normal.  Left Ear: External ear normal.  Nose: Nose normal. No mucosal edema or rhinorrhea.  Mouth/Throat: Mucous membranes are normal. No dental abscesses or uvula swelling.  Eyes: EOM are normal. Pupils are equal, round, and reactive to light.  Pupils are pinpoint bilaterally Diffuse conjunctival injection  PH of L eye is 8 PH of R eye is 8 No gross burns noted to cornea or other cornea defects  Neck: Normal range of motion and full passive range of motion without pain.  Pulmonary/Chest: Effort normal. No respiratory distress. He  has no rhonchi. He exhibits no crepitus.  Abdominal: Normal appearance.  Musculoskeletal: Normal range of motion.  Moves all extremities well.   Neurological: He is alert and oriented to person, place, and time. He has normal strength. No cranial nerve deficit.  Skin: Skin is warm, dry and intact. No rash noted. No erythema. No pallor.  Psychiatric: He has a normal mood and affect. His speech is normal and behavior is normal. His mood appears not anxious.   Nursing note and vitals reviewed.   ED Course  Procedures (including critical care time) Medications  ketorolac (ACULAR) 0.5 % ophthalmic solution 1 drop (not administered)  tobramycin (TOBREX) 0.3 % ophthalmic solution 1 drop (not administered)  tetracaine (PONTOCAINE) 0.5 % ophthalmic solution 2 drop (2 drops Both Eyes Given 02/06/15 0042)  fluorescein ophthalmic strip 1 strip (1 strip Both Eyes Given 02/06/15 0316)     DIAGNOSTIC STUDIES: Oxygen Saturation is 95% on RA, adequate by my interpretation.    COORDINATION OF CARE: 12:29 AM-Discussed treatment plan with pt at bedside and pt agreed to plan.  Patient was given tetracaine in both eyes for pain and had both eyes flushed with normal saline. The plan is to flush his eyes until the pH is normal.   3:57 AM- pH of both eyes has improved to 7. Woods light shows no uptake on either cornea. Pt is able to open his eyes now.    04:22 Visual Acuity KT  Visual Acuity - Bilateral Near: 20/40 ; R Near: 20/40 ; L Near: 20/50     Patient was started on ketorolac eyedrops for pain and Tobrex solution for his chemical conjunctivitis. He was referred to ophthalmology.  Labs Review Labs Reviewed - No data to display  Imaging Review No results found. I have personally reviewed and evaluated these images and lab results as part of my medical decision-making.   EKG Interpretation None      MDM   Final diagnoses:  Chemical conjunctivitis of both eyes    Plan discharge  Devoria Albe, MD, FACEP   I personally performed the services described in this documentation, which was scribed in my presence. The recorded information has been reviewed and considered.  Devoria Albe, MD, Concha Pyo, MD 02/06/15 [phone]

## 2015-02-06 NOTE — Discharge Instructions (Signed)
Use the eye drops in both eyes 4 times a day. One is for pain, the other is an antibiotic (tobrex). Also use artifical tears for comfort. Call Dr Florence Canner office today to have him recheck your eyes later today or tomorrow at the latest.  He is an ophthalmologist (eye specialist). Return to the ED if your symptoms seem to be getting worse instead of better.    Chemical Conjunctivitis A thin membrane covers the eyeball and underside of the eyelids. This membrane (conjunctiva) can become irritated by chemicals. The membrane may get puffy (swollen) and red. Your eyes may become teary, sensitive to light, and gritty feeling. You may also have burning eye pain. HOME CARE  Apply a cool, clean cloth to your eye for 10 to 20 minutes, 3 to 4 times a day.  Do not rub your eyes.  Wipe away fluid from the eyes with damp tissues.  Wash your hands often with soap and water.  Wear sunglasses if light bothers you.  Do not wear eye makeup.  Do not use your soft contacts. Throw them away. Use a new pair once recovery is complete.  Do not use your hard contacts. They need to be washed (sterilized) thoroughly after recovery is complete.  Do not use machines or drive if you have blurry vision.  Only take medicine as told by your doctor.  Avoid the chemical causing the eye irritation. Use eye protection as needed. GET HELP RIGHT AWAY IF:   Your eye is still pink 3 days after treatment.  You have more pain in your eye.  You have fluid coming from either eye.  Your eyelids stick together in the morning.  You become sensitive to light.  You have a temperature by mouth above 102 F (38.9 C).  You develop pain in your face.  You have problems with your medicine.  Your vision is getting worse.  You have severe pain in your eye. MAKE SURE YOU:   Understand these instructions.  Will watch your condition.  Will get help right away if you are not doing well or get worse. Document Released:  05/11/2005 Document Revised: 08/03/2011 Document Reviewed: 08/23/2008 [facility] Patient Information 2015 [place], [place]. This information is not intended to replace advice given to you by your health care provider. Make sure you discuss any questions you have with your health care provider.  Eye Drops Use eye drops as directed. It may be easier to have someone help you put the drops in your eye. If you are alone, use the following instructions to help you.  Wash your hands before putting drops in your eyes.  Read the label and look at your medication. Check for any expiration date that may appear on the bottle or tube. Changes of color may be a warning that the medication is old or ineffective. This is especially true if the medication has become brown in color. If you have questions or concerns, call your caregiver. DROPS  Tilt your head back with the affected eye uppermost. Gently pull down on your lower lid. Do not pull up on the upper lid.  Look up. Place the dropper or bottle just over the edge of the lower lid near the white portion at the bottom of the eye. The goal is to have the drop go into the little sac formed by the lower lid and the bottom of the eye itself. Do not release the drop from a height of several inches over the eye. That will only  serve to startle the person receiving the medicine when it lands and forces a blink.  Steady your hand in a comfortable manner. An example would be to hold the dropper or bottle between your thumb and index (pointing) finger. Lean your index finger against the brow.  Then, slowly and gently squeeze one drop of medication into your eye.  Once the medication has been applied, place your finger between the lower eyelid and the nose, pressing firmly against the nose for 5-10 seconds. This will slow the process of the eye drop entering the small canal that normally drains tears into the nose, and therefore increases the exposure of the medicine to the  eye for a few extra seconds. OINTMENTS  Look up. Place the tip of the tube just over the edge of the lower lid near the white portion at the bottom of the eye. The goal is to create a line of ointment along the inner surface of the eyelid in the little sac formed by the lower lid and the bottom of the eye itself.  Avoid touching the tube tip to your eyeball or eyelid. This avoids contamination of the tube or the medicine in the tube.  Once a line of medicine has been created, hold the upper lid up and look down before releasing the upper lid. This will force the ointment to spread over the surface of the eye.  Your vision will be very blurry for a few minutes after applying an ointment properly. This is normal and will clear as you continue to blink. For this reason, it is best to apply ointments just before going to sleep, or at a time when you can rest your eyes for 5-10 minutes after applying the medication. GENERAL  Store your medicine in a cool, dry place after each use.  If you need a second medication, wait at least two minutes. This helps the first medication to be taken up (absorbed) by the eye.  If you have been instructed to use both an eye drop and an eye ointment, always apply the drop first and then the ointment 3-4 minutes afterward. Never put medications into the eye unless the label reads, "For Ophthalmic Use," "For Use In Eyes" or "Eye Drops." If you have questions, call your caregiver. Document Released: 08/17/2000 Document Revised: 09/25/2013 Document Reviewed: 10/23/2008 [facility] Patient Information 2015 [place], [place]. This information is not intended to replace advice given to you by your health care provider. Make sure you discuss any questions you have with your health care provider.

## 2015-02-06 NOTE — ED Notes (Signed)
Patient presents after being splashed in the eyes with bleach.  Stated he dropped a container of bleach and it hit the counter and splashed up into his eyes

## 2015-02-06 NOTE — ED Notes (Signed)
Patient and SO attempted to flush eyes with water and used milk also to help with the burning.

## 2019-12-24 DEATH — deceased
